# Patient Record
Sex: Female | Born: 1960 | Race: Asian | Hispanic: No | Marital: Single | Smoking: Current every day smoker
Health system: Southern US, Community
[De-identification: ages and names within clinical notes are randomized; demographics above are authoritative.]

## PROBLEM LIST (undated history)

## (undated) DIAGNOSIS — F32A Depression, unspecified: Secondary | ICD-10-CM

## (undated) DIAGNOSIS — E7800 Pure hypercholesterolemia, unspecified: Secondary | ICD-10-CM

## (undated) DIAGNOSIS — F4310 Post-traumatic stress disorder, unspecified: Secondary | ICD-10-CM

## (undated) HISTORY — PX: APPENDECTOMY: SHX54

---

## 2020-08-07 ENCOUNTER — Ambulatory Visit
Admission: EM | Admit: 2020-08-07 | Discharge: 2020-08-07 | Disposition: A | Discharge status: Home / Self Care | Payer: Non-veteran care | Attending: Emergency Medicine | Admitting: Emergency Medicine

## 2020-08-07 ENCOUNTER — Ambulatory Visit (INDEPENDENT_AMBULATORY_CARE_PROVIDER_SITE_OTHER): Payer: No Typology Code available for payment source

## 2020-08-07 ENCOUNTER — Other Ambulatory Visit: Payer: Self-pay

## 2020-08-07 ENCOUNTER — Ambulatory Visit: Payer: Non-veteran care

## 2020-08-07 ENCOUNTER — Encounter: Payer: Self-pay | Admitting: Emergency Medicine

## 2020-08-07 DIAGNOSIS — M25531 Pain in right wrist: Secondary | ICD-10-CM

## 2020-08-07 MED ORDER — IBUPROFEN 800 MG PO TABS: 800.0000 mg | ORAL_TABLET | Freq: Three times a day (TID) | ORAL | 0 refills | Status: DC | PRN

## 2020-08-07 NOTE — ED Triage Notes (Addendum)
Pt said she fell up the step a few times and catches herself with her hands. Pt not sure but no obvious deformity and able to move, just hard to pick up stuff and some swelling in her right wrist. Increasing pain this past week.

## 2020-08-07 NOTE — ED Provider Notes (Signed)
Christy Coleman    CSN: [id number] Arrival date & time: 08/07/20  1151      History   Chief Complaint Chief Complaint  Patient presents with  . Wrist Pain    HPI Christy Coleman is a 59 y.o. female.   Patient presents with right wrist pain x1 week.  She cannot identify a specific injury but states she fell up the steps a few times and caught herself with her hands.  She states the pain has gotten progressively worse.  No open wounds, rash, erythema, fever, chills, numbness, paresthesias, weakness.  OTC treatment attempted at home.  She denies pertinent medical history.  The history is provided by the patient.    History reviewed. No pertinent past medical history.  There are no problems to display for this patient.   Past Surgical History:  Procedure Laterality Date  . APPENDECTOMY      OB History   No obstetric history on file.      Home Medications    Prior to Admission medications   Medication Sig Start Date End Date Taking? Authorizing Provider  ibuprofen (ADVIL) 800 MG tablet Take 1 tablet (800 mg total) by mouth every 8 (eight) hours as needed. 08/07/20   Mickie Bail, NP    Family History History reviewed. No pertinent family history.  Social History Social History   Tobacco Use  . Smoking status: Current Every Day Smoker    Types: E-cigarettes  . Smokeless tobacco: Current User  Vaping Use  . Vaping Use: Every day  Substance Use Topics  . Alcohol use: Yes  . Drug use: Never     Allergies   Patient has no allergy information on record.   Review of Systems Review of Systems  Constitutional: Negative for chills and fever.  HENT: Negative for ear pain and sore throat.   Eyes: Negative for pain and visual disturbance.  Respiratory: Negative for cough and shortness of breath.   Cardiovascular: Negative for chest pain and palpitations.  Gastrointestinal: Negative for abdominal pain and vomiting.  Genitourinary: Negative for dysuria  and hematuria.  Musculoskeletal: Positive for arthralgias. Negative for back pain.  Skin: Negative for color change and rash.  Neurological: Negative for seizures, syncope, weakness and numbness.  All other systems reviewed and are negative.    Physical Exam Triage Vital Signs ED Triage Vitals  Enc Vitals Group     BP      Pulse      Resp      Temp      Temp src      SpO2      Weight      Height      Head Circumference      Peak Flow      Pain Score      Pain Loc      Pain Edu?      Excl. in GC?    No data found.  Updated Vital Signs BP 124/79 (BP Location: Left Arm)   Pulse 91   Temp 98.8 F (37.1 C) (Oral)   Resp 16   Ht 5' (1.524 m)   Wt 193 lb (87.5 kg)   SpO2 97%   BMI 37.69 kg/m   Visual Acuity Right Eye Distance:   Left Eye Distance:   Bilateral Distance:    Right Eye Near:   Left Eye Near:    Bilateral Near:     Physical Exam Vitals and nursing note reviewed.  Constitutional:  General: She is not in acute distress.    Appearance: She is well-developed.  HENT:     Head: Normocephalic and atraumatic.     Mouth/Throat:     Mouth: Mucous membranes are moist.  Eyes:     Conjunctiva/sclera: Conjunctivae normal.  Cardiovascular:     Rate and Rhythm: Normal rate and regular rhythm.     Heart sounds: Normal heart sounds.  Pulmonary:     Effort: Pulmonary effort is normal. No respiratory distress.     Breath sounds: Normal breath sounds.  Abdominal:     Palpations: Abdomen is soft.     Tenderness: There is no abdominal tenderness.  Musculoskeletal:        General: Tenderness present. No swelling or deformity.       Hands:     Cervical back: Neck supple.  Skin:    General: Skin is warm and dry.     Capillary Refill: Capillary refill takes less than 2 seconds.     Findings: No bruising, erythema, lesion or rash.  Neurological:     General: No focal deficit present.     Mental Status: She is alert and oriented to person, place, and time.      Sensory: No sensory deficit.     Motor: No weakness.     Gait: Gait normal.  Psychiatric:        Mood and Affect: Mood normal.        Behavior: Behavior normal.      UC Treatments / Results  Labs (all labs ordered are listed, but only abnormal results are displayed) Labs Reviewed - No data to display  EKG   Radiology DG Wrist Complete Right  Result Date: 08/07/2020 CLINICAL DATA:  Fall 1 week ago with right wrist pain. EXAM: RIGHT WRIST - COMPLETE 3+ VIEW COMPARISON:  None. FINDINGS: Minimal degenerative changes over the radiocarpal joint, carpal bones and first carpometacarpal joint. No evidence of acute fracture or dislocation. IMPRESSION: No acute findings. Electronically Signed   By: Elberta Fortis M.D.   On: 08/07/2020 12:51    Procedures Procedures (including critical care time)  Medications Ordered in UC Medications - No data to display  Initial Impression / Assessment and Plan / UC Course  I have reviewed the triage vital signs and the nursing notes.  Pertinent labs & imaging results that were available during my care of the patient were reviewed by me and considered in my medical decision making (see chart for details).   Right wrist pain.  Xray negative.  Treating with wrist splint, rest, elevation, ice packs, ibuprofen.  Instructed patient to follow-up with orthopedics if her symptoms are not improving.  Patient agrees to plan of care.   Final Clinical Impressions(s) / UC Diagnoses   Final diagnoses:  Right wrist pain     Discharge Instructions     Take the ibuprofen as prescribed.  Rest and elevate your wrist.  Apply ice packs 2-3 times a day for up to 20 minutes each.  Wear the wrist splint as needed for comfort.    Follow up with an orthopedist if you symptoms are not improving.       ED Prescriptions    Medication Sig Dispense Auth. Provider   ibuprofen (ADVIL) 800 MG tablet Take 1 tablet (800 mg total) by mouth every 8 (eight) hours as  needed. 21 tablet Mickie Bail, NP     PDMP not reviewed this encounter.   Mickie Bail, NP 08/07/20 1258

## 2020-08-07 NOTE — Discharge Instructions (Signed)
Take the ibuprofen as prescribed.  Rest and elevate your wrist.  Apply ice packs 2-3 times a day for up to 20 minutes each.  Wear the wrist splint as needed for comfort.    Follow up with an orthopedist if you symptoms are not improving.

## 2021-03-27 IMAGING — DX DG WRIST COMPLETE 3+V*R*
4 series · 4 of 4 positions shown · non-contrast
Comparison: None.

CLINICAL DATA: Fall 1 week ago with right wrist pain.

EXAM:
RIGHT WRIST - COMPLETE 3+ VIEW

[wrist pa (1 of 2)]
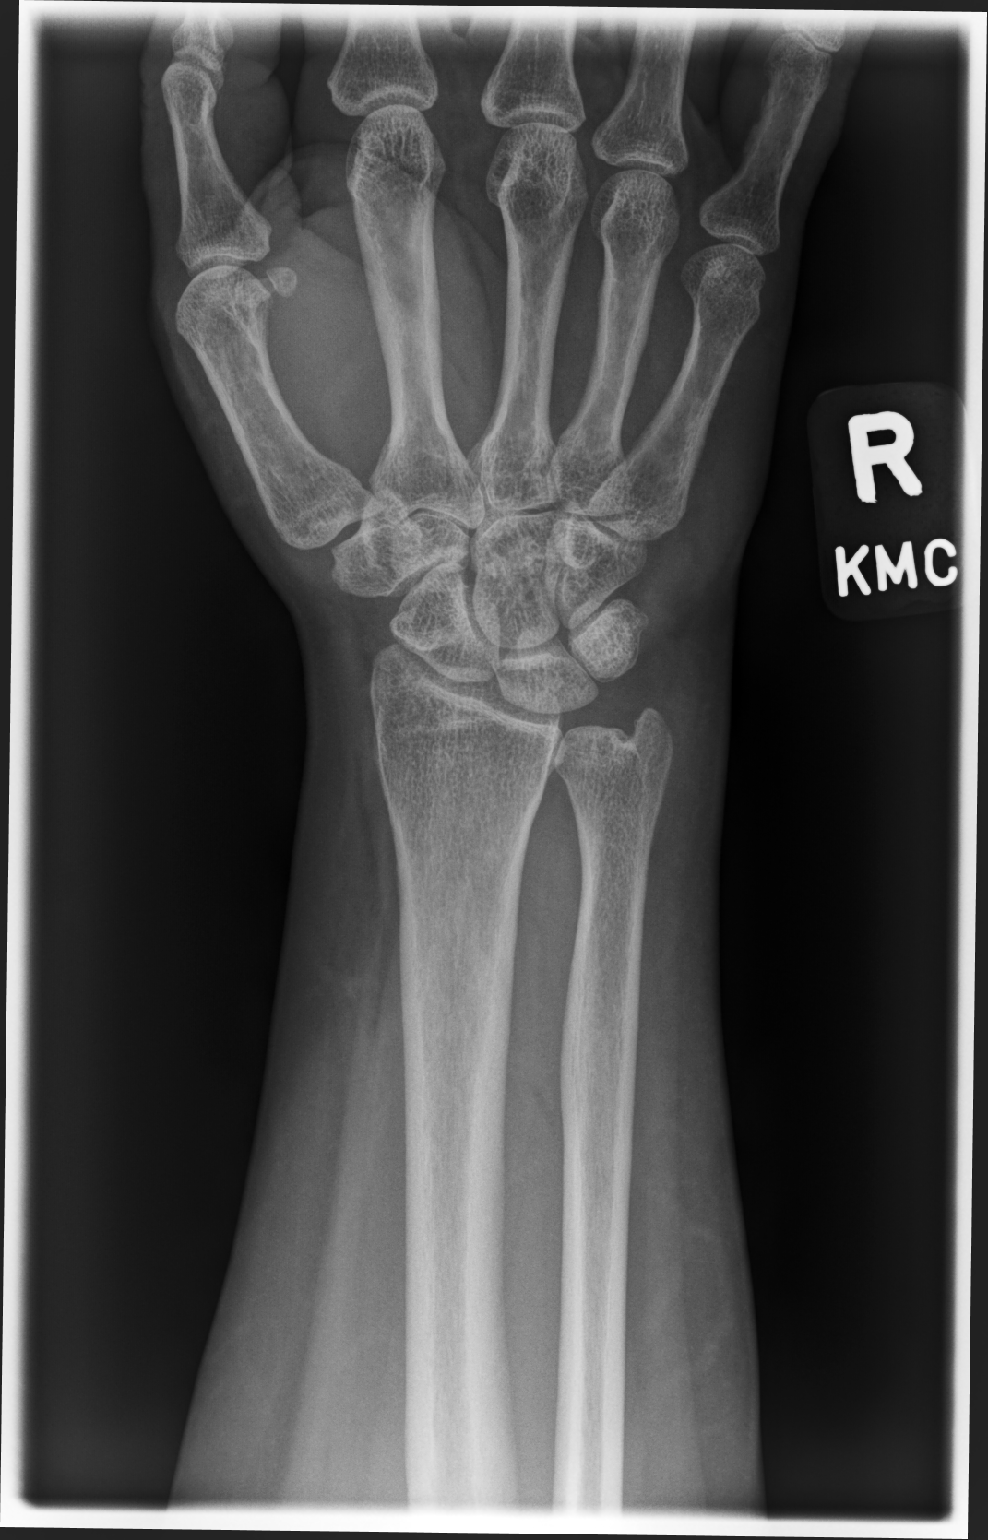

[wrist pa (2 of 2)]
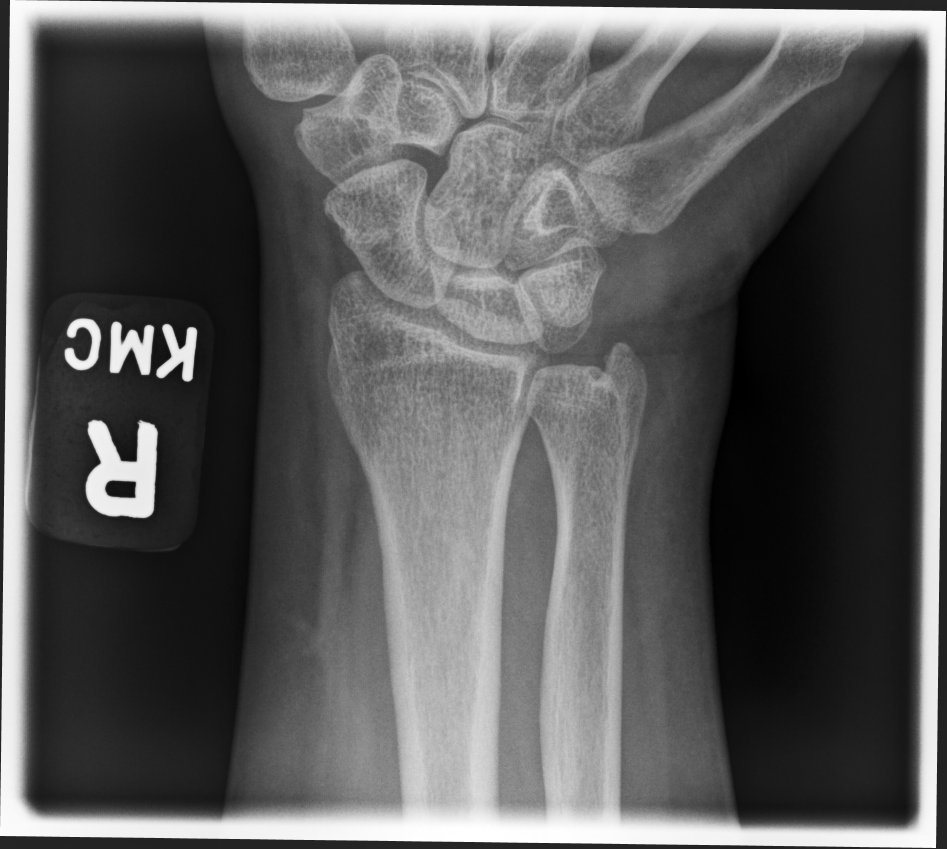

[wrist mlo]
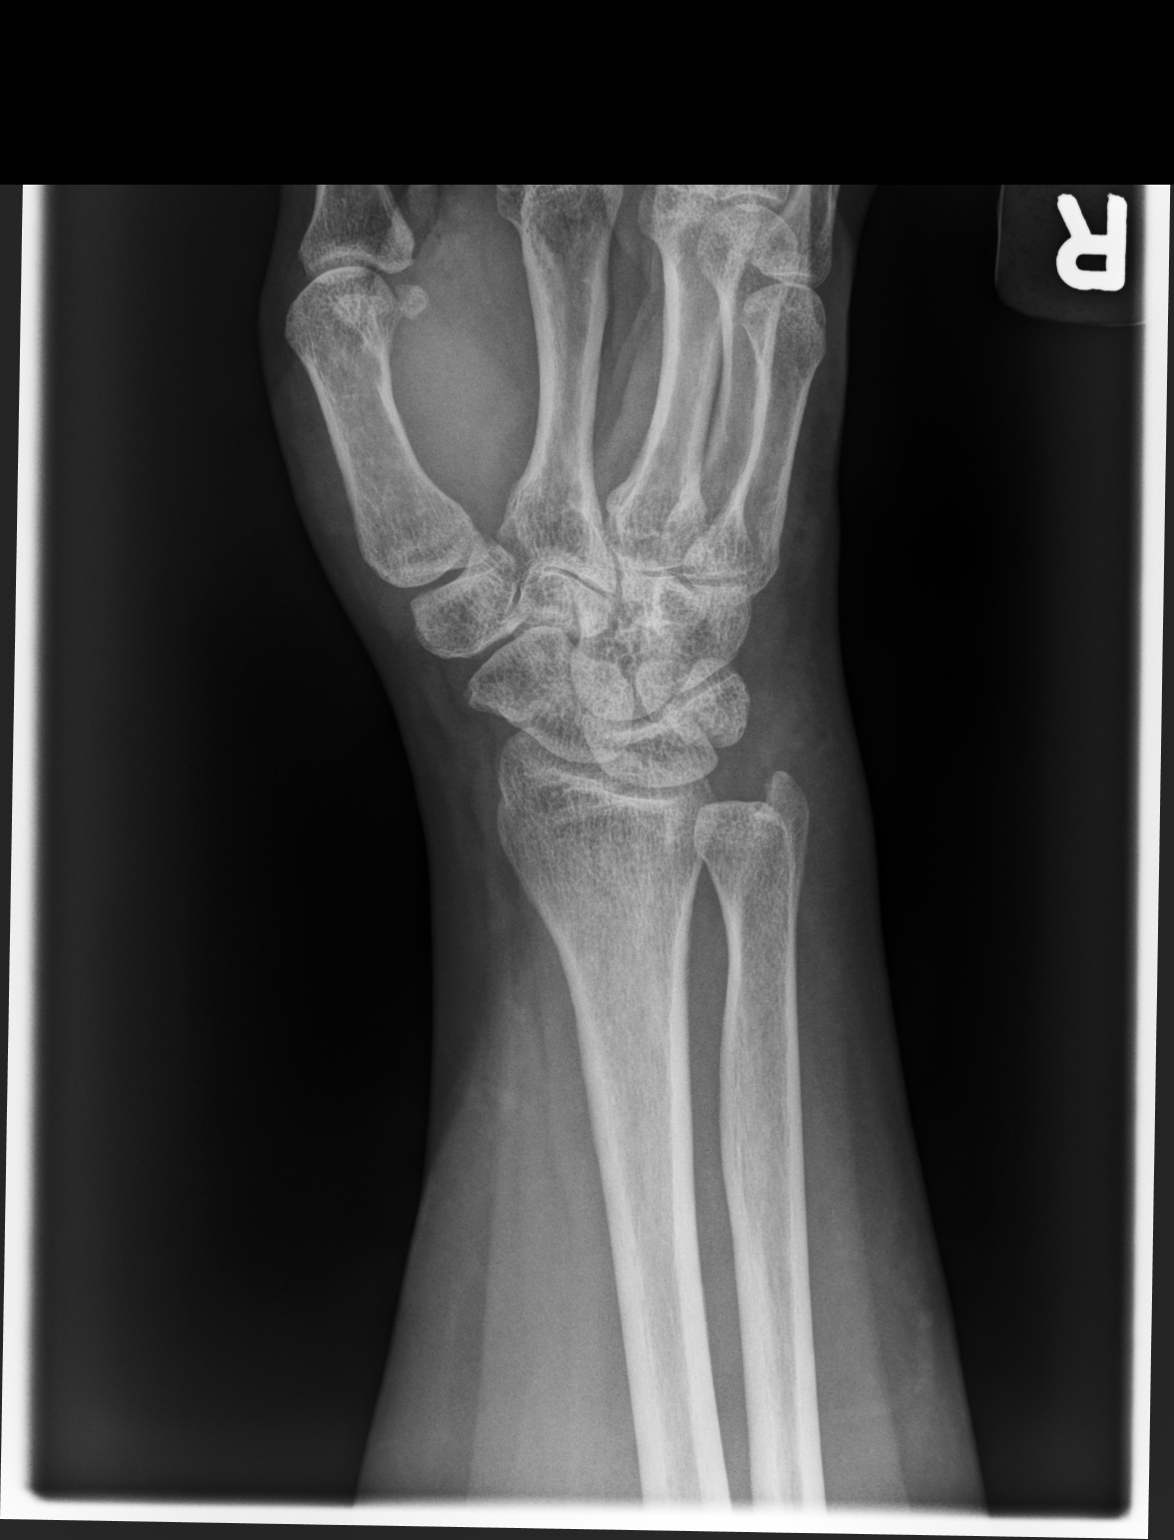

[wrist lat]
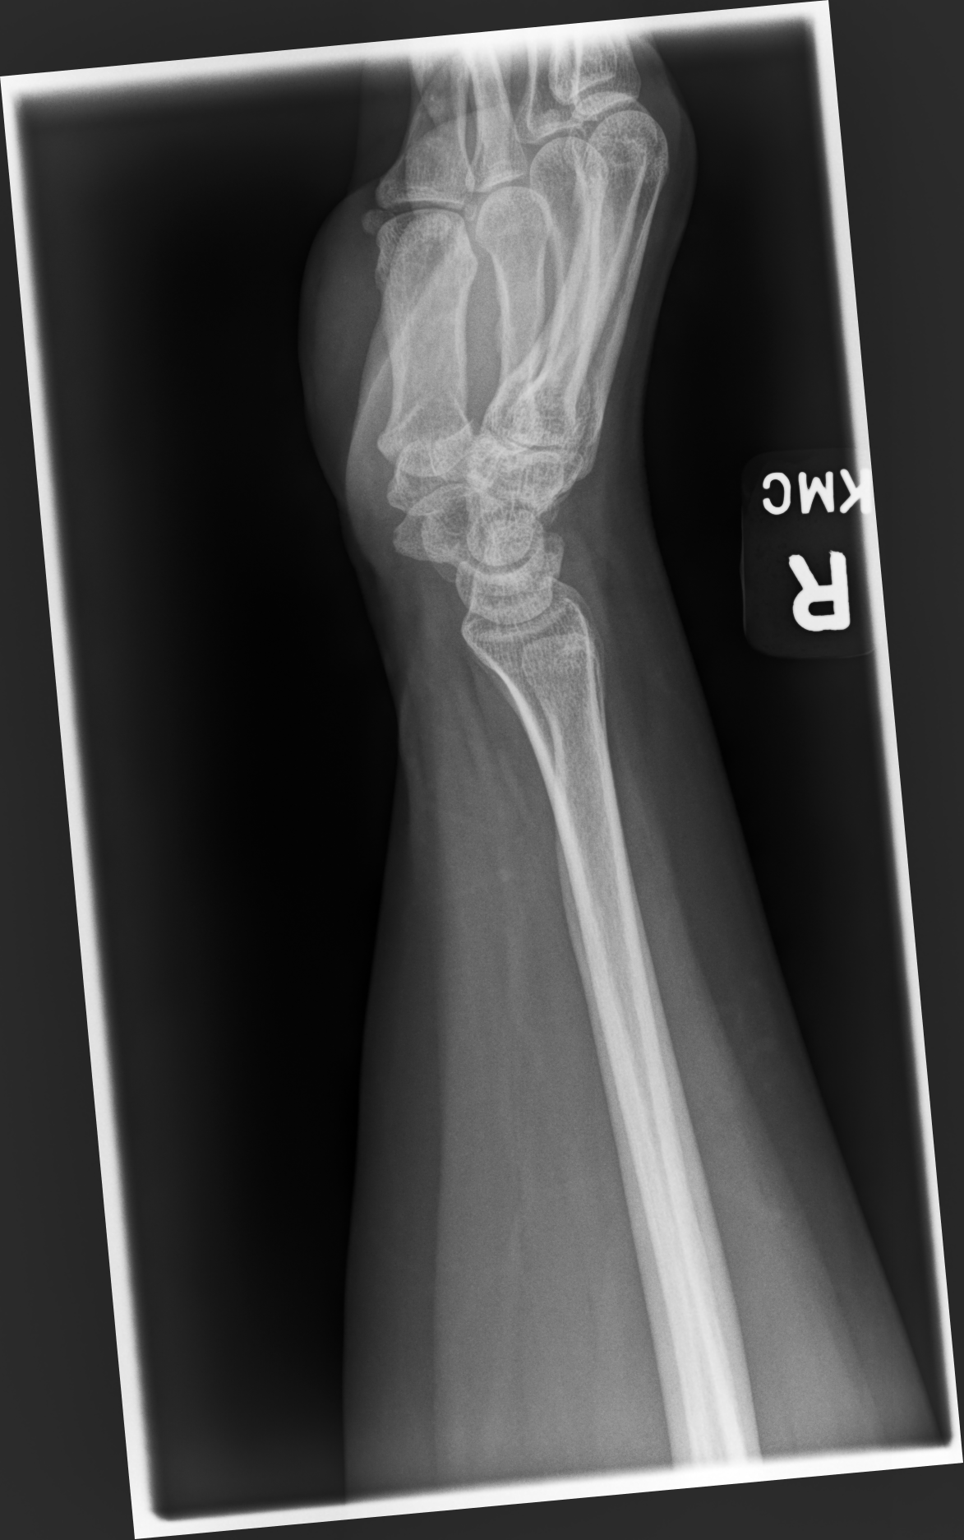

[4 of 4 positions shown; findings below may reference images not displayed]

FINDINGS: Minimal degenerative changes over the radiocarpal joint, carpal
bones and first carpometacarpal joint. No evidence of acute fracture
or dislocation.
IMPRESSION: No acute findings.

## 2021-06-29 ENCOUNTER — Encounter: Payer: Self-pay | Admitting: Emergency Medicine

## 2021-06-29 ENCOUNTER — Ambulatory Visit: Payer: Self-pay

## 2021-06-29 ENCOUNTER — Other Ambulatory Visit: Payer: Self-pay

## 2021-06-29 ENCOUNTER — Ambulatory Visit
Admission: EM | Admit: 2021-06-29 | Discharge: 2021-06-29 | Disposition: A | Discharge status: Home / Self Care | Payer: Non-veteran care | Attending: Emergency Medicine | Admitting: Emergency Medicine

## 2021-06-29 DIAGNOSIS — J0140 Acute pansinusitis, unspecified: Secondary | ICD-10-CM | POA: Diagnosis not present

## 2021-06-29 DIAGNOSIS — J069 Acute upper respiratory infection, unspecified: Secondary | ICD-10-CM

## 2021-06-29 HISTORY — DX: Depression, unspecified: F32.A

## 2021-06-29 HISTORY — DX: Pure hypercholesterolemia, unspecified: E78.00

## 2021-06-29 MED ORDER — FLUTICASONE PROPIONATE 50 MCG/ACT NA SUSP: 2.0000 | Freq: Every day | NASAL | 0 refills | Status: AC

## 2021-06-29 MED ORDER — AEROCHAMBER PLUS MISC: 2 refills | Status: AC

## 2021-06-29 MED ORDER — BENZONATATE 200 MG PO CAPS: 200.0000 mg | ORAL_CAPSULE | Freq: Three times a day (TID) | ORAL | 0 refills | Status: AC | PRN

## 2021-06-29 MED ORDER — ALBUTEROL SULFATE HFA 108 (90 BASE) MCG/ACT IN AERS
1.0000 | INHALATION_SPRAY | RESPIRATORY_TRACT | 0 refills | Status: AC | PRN
Start: 2021-06-29 — End: ?

## 2021-06-29 MED ORDER — AMOXICILLIN-POT CLAVULANATE 875-125 MG PO TABS: 1.0000 | ORAL_TABLET | Freq: Two times a day (BID) | ORAL | 0 refills | Status: DC

## 2021-06-29 NOTE — ED Provider Notes (Signed)
HPI  SUBJECTIVE:  Christy Coleman is a 60 y.o. female who presents with 5 days of fevers T-max 101, body aches, headaches, nasal congestion, rhinorrhea, postnasal drip, sore throat, loss of sense of smell and taste, cough, wheezing, chest soreness/tightness, shortness of breath.  No nausea, vomiting, diarrhea, abdominal pain.  No known COVID exposure.  She had a second dose of the COVID-vaccine.  She had a negative COVID PCR test yesterday.  No antibiotics in the past month.  She took a BC powder within the past 6 hours.  She has been doing Chloraseptic spray, BC powders, Neti pot, and NyQuil/DayQuil, cough drops, Vicks nasal spray without relief.  No aggravating factors.  She is a former smoker and has a history of hypercholesterolemia.  PMD: None.  Past Medical History:  Diagnosis Date   Depression    High cholesterol     Past Surgical History:  Procedure Laterality Date   APPENDECTOMY      History reviewed. No pertinent family history.  Social History   Tobacco Use   Smoking status: Every Day    Types: E-cigarettes   Smokeless tobacco: Current  Vaping Use   Vaping Use: Every day  Substance Use Topics   Alcohol use: Yes   Drug use: Never    No current facility-administered medications for this encounter.  Current Outpatient Medications:    albuterol (VENTOLIN HFA) 108 (90 Base) MCG/ACT inhaler, Inhale 1-2 puffs into the lungs every 4 (four) hours as needed for wheezing or shortness of breath., Disp: 1 each, Rfl: 0   amoxicillin-clavulanate (AUGMENTIN) 875-125 MG tablet, Take 1 tablet by mouth 2 (two) times daily. X 7 days, Disp: 14 tablet, Rfl: 0   ATORVASTATIN CALCIUM PO, Take by mouth., Disp: , Rfl:    benzonatate (TESSALON) 200 MG capsule, Take 1 capsule (200 mg total) by mouth 3 (three) times daily as needed for cough., Disp: 30 capsule, Rfl: 0   buPROPion (WELLBUTRIN XL) 300 MG 24 hr tablet, TAKE ONE TAB BY MOUTH EVERY 24 HOURS, Disp: , Rfl:    Cholecalciferol (VITAMIN  D3) 30 MCG/15ML LIQD, Take by mouth., Disp: , Rfl:    fluticasone (FLONASE) 50 MCG/ACT nasal spray, Place 2 sprays into both nostrils daily., Disp: 16 g, Rfl: 0   lithium 300 MG tablet, TAKE ONE TABLET BY MOUTH EVERY MORNING AND TAKE TWO TABLETS EVERY NIGHT, Disp: , Rfl:    Omega-3 Fatty Acids (FISH OIL) 1000 MG CAPS, TAKE ONE CAPSULE BY MOUTH EVERY DAY TO HELP LOWER YOUR TRIGLYCERIDES.  TAKE FROZEN IF GIVE FISHY TASTING  BURPS. TO HELP LOWER YOUR TRIGLYCERIDES.  TAKE FROZEN IF GIVE FISHY TASTING   BURPS., Disp: , Rfl:    omeprazole (PRILOSEC) 40 MG capsule, Take by mouth., Disp: , Rfl:    prazosin (MINIPRESS) 1 MG capsule, TAKE ONE CAPSULE BY MOUTH AT BEDTIME FOR 7 DAYS, THEN TAKE TWO CAPSULES AT BEDTIME FOR NIGHTMARES, Disp: , Rfl:    QUEtiapine (SEROQUEL) 100 MG tablet, TAKE TWO TABLETS BY MOUTH AT BEDTIME AND TAKE ONE-HALF TABLET TWO TIMES A DAY AS NEEDED, Disp: , Rfl:    sertraline (ZOLOFT) 100 MG tablet, TAKE TWO TABLETS BY MOUTH ONCE EVERY DAY FOR MOOD (CHANGE IN DOSE), Disp: , Rfl:    Spacer/Aero-Holding Chambers (AEROCHAMBER PLUS) inhaler, Use with inhaler, Disp: 1 each, Rfl: 2   topiramate (TOPAMAX) 25 MG tablet, 1 tab by mouth in the morning and 2 tabs by mouth in the evening for 1 week. Then 2 tabs by mouth  2 times per day thereafter., Disp: , Rfl:   Allergies  Allergen Reactions   Gabapentin Diarrhea     ROS  As noted in HPI.   Physical Exam  BP 120/82 (BP Location: Left Arm)   Pulse 81   Temp 99.4 F (37.4 C) (Oral)   Resp 16   SpO2 96%   Constitutional: Well developed, well nourished, no acute distress Eyes:  EOMI, conjunctiva normal bilaterally HENT: Normocephalic, atraumatic,mucus membranes moist.  Positive purulent nasal congestion.  Normal turbinates.  Positive maxillary, frontal sinus tenderness.  No obvious postnasal drip. Respiratory: Normal inspiratory effort, diffuse expiratory wheezing throughout all lung fields.  No rales, rhonchi.  No anterior, lateral  chest wall tenderness Cardiovascular: Normal rate, regular rhythm, no murmurs rubs or gallops. GI: nondistended skin: No rash, skin intact Musculoskeletal: no deformities Neurologic: Alert & oriented x 3, no focal neuro deficits Psychiatric: Speech and behavior appropriate   ED Course   Medications - No data to display  No orders of the defined types were placed in this encounter.   No results found for this or any previous visit (from the past 24 hour(s)). No results found.  ED Clinical Impression  1. Acute non-recurrent pansinusitis   2. Acute upper respiratory infection      ED Assessment/Plan  Did not check a COVID PCR because she had a negative PCR yesterday.  Even if it is positive, she would be out of the window for treatment.  Treating as a sinusitis with bronchospasm.  Home with Augmentin, which will also cover pneumonia, regularly scheduled albuterol with a spacer, Tylenol, no ibuprofen because of lithium, Mucinex D, Flonase, continue saline nasal irrigation, Tessalon for the cough.  Will provide primary care list and order assistance in finding a PMD.  Discussed labs, imaging, MDM, treatment plan, and plan for follow-up with patient.  patient agrees with plan.   Meds ordered this encounter  Medications   amoxicillin-clavulanate (AUGMENTIN) 875-125 MG tablet    Sig: Take 1 tablet by mouth 2 (two) times daily. X 7 days    Dispense:  14 tablet    Refill:  0   fluticasone (FLONASE) 50 MCG/ACT nasal spray    Sig: Place 2 sprays into both nostrils daily.    Dispense:  16 g    Refill:  0   Spacer/Aero-Holding Chambers (AEROCHAMBER PLUS) inhaler    Sig: Use with inhaler    Dispense:  1 each    Refill:  2    Please educate patient on use   albuterol (VENTOLIN HFA) 108 (90 Base) MCG/ACT inhaler    Sig: Inhale 1-2 puffs into the lungs every 4 (four) hours as needed for wheezing or shortness of breath.    Dispense:  1 each    Refill:  0   benzonatate (TESSALON) 200  MG capsule    Sig: Take 1 capsule (200 mg total) by mouth 3 (three) times daily as needed for cough.    Dispense:  30 capsule    Refill:  0      *This clinic note was created using Scientist, clinical (histocompatibility and immunogenetics). Therefore, there may be occasional mistakes despite careful proofreading.  ?    Domenick Gong, MD 06/30/21 [phone]

## 2021-06-29 NOTE — ED Triage Notes (Signed)
Pt c/o cough, headache, sinus pressure, chills, and bodyaches x 5 days. Pts covid test yesterday was negative.

## 2021-06-29 NOTE — Discharge Instructions (Addendum)
Continue using your Nettie pot.  Finish the Augmentin, even if you feel better.  Stop DayQuil, NyQuil, take 1000 mg of Tylenol 3-4 times a day as needed for headaches, body aches, fevers.  Mucinex D will help with the nasal congestion.  Start some Flonase.  2 puffs from your albuterol inhaler using your spacer every 4 hours for 2 days, then every 6 hours for 2 days, then as needed.  You may back off on the albuterol if you start to feel better.  Tessalon for cough.  Here is a list of primary care providers who are taking new patients:  Dr. Elizabeth Sauer [address] [place] [address] [phone]  [facility] at [facility] [address] [place], [place] [address] [phone]  [facility] [place] [address] [place] [address]  [phone]  [facility] [address] [place], [place] [address] [phone]  [facility] [address] [place]  [phone] [place], [place] [address]  Here are clinics/ other resources who will see you if you do not have insurance. Some have certain criteria that you must meet. Call them and find out what they are:  [facility]: [address] [place], [place] [address] Phone: [phone] Hours: First and Third Saturdays of each Month, 9 a.m. - 1 p.m.  [facility]: [address], [address] [place], [place] [address] Phone: [phone] Hours: Tuesday, 4 p.m. - 8 p.m. Thursday, 1 p.m. - 8 p.m. Wednesday, 9 a.m. - [facility] [address], [place], [place] [address] Phone: [phone] Pharmacy Phone Number: [phone] Dental Phone Number: [phone] [facility] Insurance Help: [phone]  Dental Hours: Monday - Thursday, 8 a.m. - 6 p.m.  [facility] [address], [place], [place] [address] Phone: [phone] Pharmacy Phone Number: [phone] [facility] Insurance Help: [phone]  [facility] [address] [place]  [place], [place] [address] Phone: [phone] Pharmacy Phone Number: [phone] [facility] Insurance Help: [phone]  [facility] [address] [place], [place] [address] Phone: [phone] [facility] Insurance Help: [phone]   [facility] [address] [place], [place] [address] Phone: [phone]  Go to www.goodrx.com  or www.costplusdrugs.com to look up your medications. This will give you a list of where you can find your prescriptions at the most affordable prices. Or ask the pharmacist what the cash price is, or if they have any other discount programs available to help make your medication more affordable. This can be less expensive than what you would pay with insurance.

## 2022-12-30 ENCOUNTER — Ambulatory Visit
Admission: RE | Admit: 2022-12-30 | Discharge: 2022-12-30 | Disposition: A | Discharge status: Home / Self Care | Payer: No Typology Code available for payment source | Source: Ambulatory Visit | Attending: Urgent Care | Admitting: Urgent Care

## 2022-12-30 VITALS — BP 119/75 | HR 96 | Temp 98.0°F | Resp 20

## 2022-12-30 DIAGNOSIS — L02211 Cutaneous abscess of abdominal wall: Secondary | ICD-10-CM

## 2022-12-30 MED ORDER — SULFAMETHOXAZOLE-TRIMETHOPRIM 800-160 MG PO TABS: 1.0000 | ORAL_TABLET | Freq: Two times a day (BID) | ORAL | 0 refills | Status: DC

## 2022-12-30 MED ORDER — SULFAMETHOXAZOLE-TRIMETHOPRIM 800-160 MG PO TABS: 1.0000 | ORAL_TABLET | Freq: Two times a day (BID) | ORAL | 0 refills | Status: AC

## 2022-12-30 NOTE — ED Provider Notes (Signed)
[facility]    CSN: [id number] Arrival date & time: 12/30/22  1106      History   Chief Complaint Chief Complaint  Patient presents with   Abscess    Under stomach and has been two weeks and draining/oozy for four days. Very painful - Entered by patient    HPI Christy Coleman is a 62 y.o. female.    Abscess   Patient presents to urgent care with complaint of painful right lower quadrant abscess x 2 weeks.  She notes drainage which appear yellow and bloody x 4 days.  She states she has been cleaning the site with alcohol.  Denies fever.  Past Medical History:  Diagnosis Date   Depression    High cholesterol     There are no problems to display for this patient.   Past Surgical History:  Procedure Laterality Date   APPENDECTOMY      OB History   No obstetric history on file.      Home Medications    Prior to Admission medications   Medication Sig Start Date End Date Taking? Authorizing Provider  albuterol (VENTOLIN HFA) 108 (90 Base) MCG/ACT inhaler Inhale 1-2 puffs into the lungs every 4 (four) hours as needed for wheezing or shortness of breath. 06/29/21   Domenick Gong, MD  amoxicillin-clavulanate (AUGMENTIN) 875-125 MG tablet Take 1 tablet by mouth 2 (two) times daily. X 7 days 06/29/21   Domenick Gong, MD  ATORVASTATIN CALCIUM PO Take by mouth.    [provider]  benzonatate (TESSALON) 200 MG capsule Take 1 capsule (200 mg total) by mouth 3 (three) times daily as needed for cough. 06/29/21   Domenick Gong, MD  buPROPion (WELLBUTRIN XL) 300 MG 24 hr tablet TAKE ONE TAB BY MOUTH EVERY 24 HOURS 03/04/21   [provider]  Cholecalciferol (VITAMIN D3) 30 MCG/15ML LIQD Take by mouth.    [provider]  fluticasone (FLONASE) 50 MCG/ACT nasal spray Place 2 sprays into both nostrils daily. 06/29/21   Domenick Gong, MD  lithium 300 MG tablet TAKE ONE TABLET BY MOUTH EVERY MORNING AND TAKE TWO TABLETS EVERY NIGHT  03/04/21   [provider]  Omega-3 Fatty Acids (FISH OIL) 1000 MG CAPS TAKE ONE CAPSULE BY MOUTH EVERY DAY TO HELP LOWER YOUR TRIGLYCERIDES.  TAKE FROZEN IF GIVE FISHY TASTING  BURPS. TO HELP LOWER YOUR TRIGLYCERIDES.  TAKE FROZEN IF GIVE FISHY TASTING   BURPS. 07/04/20   [provider]  omeprazole (PRILOSEC) 40 MG capsule Take by mouth. 11/01/13   [provider]  prazosin (MINIPRESS) 1 MG capsule TAKE ONE CAPSULE BY MOUTH AT BEDTIME FOR 7 DAYS, THEN TAKE TWO CAPSULES AT BEDTIME FOR NIGHTMARES 06/09/21   [provider]  QUEtiapine (SEROQUEL) 100 MG tablet TAKE TWO TABLETS BY MOUTH AT BEDTIME AND TAKE ONE-HALF TABLET TWO TIMES A DAY AS NEEDED 06/09/21   [provider]  sertraline (ZOLOFT) 100 MG tablet TAKE TWO TABLETS BY MOUTH ONCE EVERY DAY FOR MOOD (CHANGE IN DOSE) 03/04/21   [provider]  Spacer/Aero-Holding Chambers (AEROCHAMBER PLUS) inhaler Use with inhaler 06/29/21   Domenick Gong, MD  topiramate (TOPAMAX) 25 MG tablet 1 tab by mouth in the morning and 2 tabs by mouth in the evening for 1 week. Then 2 tabs by mouth 2 times per day thereafter. 11/22/13   [provider]    Family History History reviewed. No pertinent family history.  Social History Social History   Tobacco Use  Smoking status: Every Day    Types: E-cigarettes   Smokeless tobacco: Current  Vaping Use   Vaping Use: Every day  Substance Use Topics   Alcohol use: Yes   Drug use: Never     Allergies   Gabapentin   Review of Systems Review of Systems   Physical Exam Triage Vital Signs ED Triage Vitals [12/30/22 1113]  Enc Vitals Group     BP      Pulse      Resp      Temp      Temp src      SpO2      Weight      Height      Head Circumference      Peak Flow      Pain Score 5     Pain Loc      Pain Edu?      Excl. in GC?    No data found.  Updated Vital Signs There were no vitals taken for this visit.  Visual Acuity Right  Eye Distance:   Left Eye Distance:   Bilateral Distance:    Right Eye Near:   Left Eye Near:    Bilateral Near:     Physical Exam Abdominal:     Palpations: Abdomen is soft.     Tenderness: There is abdominal tenderness.        UC Treatments / Results  Labs (all labs ordered are listed, but only abnormal results are displayed) Labs Reviewed - No data to display  EKG   Radiology No results found.  Procedures Incision and Drainage  Date/Time: 12/30/2022 11:50 AM  Performed by: Charma Igo, FNP Authorized by: Charma Igo, FNP   Consent:    Consent obtained:  Verbal   Consent given by:  Patient   Risks discussed:  Bleeding, incomplete drainage and pain   Alternatives discussed:  No treatment Universal protocol:    Procedure explained and questions answered to patient or proxy's satisfaction: yes     Relevant documents present and verified: yes     Test results available : no     Imaging studies available: no     Required blood products, implants, devices, and special equipment available: no     Site/side marked: no     Immediately prior to procedure, a time out was called: yes     Patient identity confirmed:  Verbally with patient Location:    Type:  Abscess   Size:  3 cm   Location:  Trunk   Trunk location:  Abdomen Pre-procedure details:    Skin preparation:  Povidone-iodine Sedation:    Sedation type:  None Anesthesia:    Anesthesia method:  Topical application and local infiltration   Topical anesthesia: skin spray.   Local anesthetic:  Lidocaine 1% WITH epi Procedure type:    Complexity:  Simple Procedure details:    Ultrasound guidance: no     Needle aspiration: no     Incision types:  Stab incision   Incision depth:  Dermal   Wound management:  Probed and deloculated and irrigated with saline   Drainage:  Purulent   Drainage amount:  Scant   Wound treatment:  Wound left open   Packing materials:  1/4 in iodoform gauze   Amount  1/4" iodoform:  3 Post-procedure details:    Procedure completion:  Tolerated  (including critical care time)  Medications Ordered in UC Medications - No data to display  Initial Impression /  Assessment and Plan / UC Course  I have reviewed the triage vital signs and the nursing notes.  Pertinent labs & imaging results that were available during my care of the patient were reviewed by me and considered in my medical decision making (see chart for details).   Cutaneous abscess of RLQ, possibly HS, first episode. Will attempt I&D. Prescribing bactrim.  See procedure note.   Final Clinical Impressions(s) / UC Diagnoses   Final diagnoses:  None   Discharge Instructions   None    ED Prescriptions   None    PDMP not reviewed this encounter.   Charma Igo, [place] 12/30/22 1155

## 2022-12-30 NOTE — Discharge Instructions (Signed)
Remove packing after 24 hours.  Use compress of warm water with dissolved Epsom salts multiple times daily.  Compress abscess to try to express discharge.  Apply antibacterial ointment after compress and cover with a clean dressing.  Follow up here or with your primary care or dermatology provider if your symptoms are worsening or not improving.

## 2022-12-30 NOTE — ED Triage Notes (Signed)
Patient presents to UC for RLQ abscess x 2 weeks. Drainage noted x 4 days. Cleaning site with alcohol.   Denies fever.

## 2023-10-10 ENCOUNTER — Encounter: Payer: Self-pay | Admitting: Intensive Care

## 2023-10-10 ENCOUNTER — Emergency Department
Admission: EM | Admit: 2023-10-10 | Discharge: 2023-10-10 | Disposition: A | Discharge status: Home / Self Care | Payer: No Typology Code available for payment source | Attending: Emergency Medicine | Admitting: Emergency Medicine

## 2023-10-10 ENCOUNTER — Emergency Department: Payer: No Typology Code available for payment source

## 2023-10-10 ENCOUNTER — Other Ambulatory Visit: Payer: Self-pay

## 2023-10-10 DIAGNOSIS — Z20822 Contact with and (suspected) exposure to covid-19: Secondary | ICD-10-CM | POA: Insufficient documentation

## 2023-10-10 DIAGNOSIS — J029 Acute pharyngitis, unspecified: Secondary | ICD-10-CM | POA: Diagnosis not present

## 2023-10-10 DIAGNOSIS — J069 Acute upper respiratory infection, unspecified: Secondary | ICD-10-CM

## 2023-10-10 DIAGNOSIS — R059 Cough, unspecified: Secondary | ICD-10-CM | POA: Diagnosis present

## 2023-10-10 HISTORY — DX: Post-traumatic stress disorder, unspecified: F43.10

## 2023-10-10 LAB — CBC WITH DIFFERENTIAL/PLATELET
Abs Immature Granulocytes: 0.08 10*3/uL — ABNORMAL HIGH (ref 0.00–0.07)
Basophils Absolute: 0.1 10*3/uL (ref 0.0–0.1)
Basophils Relative: 1 %
Eosinophils Absolute: 0.2 10*3/uL (ref 0.0–0.5)
Eosinophils Relative: 2 %
HCT: 42.7 % (ref 36.0–46.0)
Hemoglobin: 13.8 g/dL (ref 12.0–15.0)
Immature Granulocytes: 1 %
Lymphocytes Relative: 27 %
Lymphs Abs: 2.1 10*3/uL (ref 0.7–4.0)
MCH: 28.1 pg (ref 26.0–34.0)
MCHC: 32.3 g/dL (ref 30.0–36.0)
MCV: 87 fL (ref 80.0–100.0)
Monocytes Absolute: 0.4 10*3/uL (ref 0.1–1.0)
Monocytes Relative: 6 %
Neutro Abs: 5 10*3/uL (ref 1.7–7.7)
Neutrophils Relative %: 63 %
Platelets: 281 10*3/uL (ref 150–400)
RBC: 4.91 MIL/uL (ref 3.87–5.11)
RDW: 13.2 % (ref 11.5–15.5)
WBC: 7.8 10*3/uL (ref 4.0–10.5)
nRBC: 0 % (ref 0.0–0.2)

## 2023-10-10 LAB — COMPREHENSIVE METABOLIC PANEL
ALT: 34 U/L (ref 0–44)
AST: 41 U/L (ref 15–41)
Albumin: 4.1 g/dL (ref 3.5–5.0)
Alkaline Phosphatase: 67 U/L (ref 38–126)
Anion gap: 10 (ref 5–15)
BUN: 9 mg/dL (ref 6–20)
CO2: 24 mmol/L (ref 22–32)
Calcium: 9.2 mg/dL (ref 8.9–10.3)
Chloride: 103 mmol/L (ref 98–111)
Creatinine, Ser: 0.66 mg/dL (ref 0.44–1.00)
GFR, Estimated: >60 mL/min (ref >60)
Glucose, Bld: 170 mg/dL — ABNORMAL HIGH (ref 70–99)
Potassium: 4.1 mmol/L (ref 3.5–5.1)
Sodium: 137 mmol/L (ref 135–145)
Total Bilirubin: 0.8 mg/dL (ref 0.0–1.2)
Total Protein: 7.3 g/dL (ref 6.5–8.1)

## 2023-10-10 LAB — RESP PANEL BY RT-PCR (RSV, FLU A&B, COVID)  RVPGX2
Influenza A by PCR: NEGATIVE
Influenza B by PCR: NEGATIVE
Resp Syncytial Virus by PCR: NEGATIVE
SARS Coronavirus 2 by RT PCR: NEGATIVE

## 2023-10-10 LAB — GROUP A STREP BY PCR: Group A Strep by PCR: NOT DETECTED

## 2023-10-10 MED ORDER — ACETAMINOPHEN 325 MG PO TABS
650.0000 mg | ORAL_TABLET | Freq: Once | ORAL | Status: AC
  Administered 2023-10-10: 650 mg via ORAL
  Filled 2023-10-10: qty 2

## 2023-10-10 NOTE — ED Triage Notes (Signed)
Patient c/o sob, right sided jaw pain, headaches, and generalized body aches X2 weeks.

## 2023-10-10 NOTE — ED Notes (Signed)
See triage note  Presents with some body aches,sore throat and some cough  States sx's started about 1 week ago

## 2023-10-10 NOTE — Discharge Instructions (Signed)
Your blood tests, COVID, flu, RSV and strep swabs, and chest x-ray are normal.  You may continue to take Tylenol/ibuprofen per package instructions to help with your symptoms.  Please return for any new, worsening, or change in symptoms or other concerns.  It was a pleasure caring for you today.

## 2023-10-10 NOTE — ED Provider Notes (Signed)
[facility] Provider Note    Event Date/Time   First MD Initiated Contact with Patient 10/10/23 1336     (approximate)   History   Shortness of Breath and Headache   HPI  Christy Coleman is a 63 y.o. female who presents today for evaluation of 2 weeks of cough, nasal congestion, and bodyaches, as well as 3 days of sore throat and right sided neck pain.  She reports that her grandchildren have been sick with the same symptoms.  She denies chest pain or shortness of breath.  She denies fevers or chills.  No abdominal pain, nausea, vomiting, diarrhea.  There are no active problems to display for this patient.         Physical Exam   Triage Vital Signs: ED Triage Vitals [10/10/23 1210]  Encounter Vitals Group     BP (!) 130/92     Systolic BP Percentile      Diastolic BP Percentile      Pulse Rate 86     Resp 20     Temp 98.4 F (36.9 C)     Temp Source Oral     SpO2 100 %     Weight 210 lb (95.3 kg)     Height 5' (1.524 m)     Head Circumference      Peak Flow      Pain Score 6     Pain Loc      Pain Education      Exclude from Growth Chart     Most recent vital signs: Vitals:   10/10/23 1210  BP: (!) 130/92  Pulse: 86  Resp: 20  Temp: 98.4 F (36.9 C)  SpO2: 100%    Physical Exam Vitals and nursing note reviewed.  Constitutional:      General: Awake and alert. No acute distress.    Appearance: Normal appearance. HENT:     Head: Normocephalic and atraumatic.     Mouth: Mucous membranes are moist. Uvula midline.  No tonsillar exudate.  No soft palate fluctuance.  No trismus.  No voice change.  No sublingual swelling.  Mild right-sided cervical lymphadenopathy.  No nuchal rigidity.  Eyes:     General: PERRL. Normal EOMs        Right eye: No discharge.        Left eye: No discharge.     Conjunctiva/sclera: Conjunctivae normal.  Cardiovascular:     Rate and Rhythm: Normal rate and regular rhythm.     Pulses: Normal pulses.   Pulmonary:     Effort: Pulmonary effort is normal. No respiratory distress.  Able to speak easily in complete sentences, no accessory muscle use    Breath sounds: Normal breath sounds.  Abdominal:     Abdomen is soft. There is no abdominal tenderness. No rebound or guarding. No distention. Musculoskeletal:        General: No swelling. Normal range of motion.     Cervical back: Normal range of motion and neck supple.  Right anterior cervical chain lymphadenopathy Skin:    General: Skin is warm and dry.     Capillary Refill: Capillary refill takes less than 2 seconds.     Findings: No rash.  Neurological:     Mental Status: The patient is awake and alert.      ED Results / Procedures / Treatments   Labs (all labs ordered are listed, but only abnormal results are displayed) Labs Reviewed  CBC WITH DIFFERENTIAL/PLATELET -  Abnormal; Notable for the following components:      Result Value   Abs Immature Granulocytes 0.08 (*)    All other components within normal limits  COMPREHENSIVE METABOLIC PANEL - Abnormal; Notable for the following components:   Glucose, Bld 170 (*)    All other components within normal limits  RESP PANEL BY RT-PCR (RSV, FLU A&B, COVID)  [id number]  GROUP A STREP BY PCR  URINALYSIS, ROUTINE W REFLEX MICROSCOPIC     EKG     RADIOLOGY I independently reviewed and interpreted imaging and agree with radiologists findings.     PROCEDURES:  Critical Care performed:   Procedures   MEDICATIONS ORDERED IN ED: Medications  acetaminophen (TYLENOL) tablet 650 mg (650 mg Oral Given 10/10/23 1348)     IMPRESSION / MDM / ASSESSMENT AND PLAN / ED COURSE  I reviewed the triage vital signs and the nursing notes.   Differential diagnosis includes, but is not limited to, COVID, influenza, bronchitis, pneumonia, strep pharyngitis, viral pharyngitis, other viral syndrome.  Patient is awake and alert, hemodynamically stable and afebrile.  She is nontoxic in  appearance.  She has a hoarse voice and cervical lymphadenopathy consistent with a pharyngitis.  Labs obtained in triage are overall reassuring, chest x-ray is negative for pneumonia.  Will add on COVID, flu, RSV and strep swabs.  In the meantime she was treated symptomatically with Tylenol.  Swabs are negative.  Upon reevaluation, patient reports that she feels significantly improved.  We discussed continued symptomatic management and strict return precautions.  Patient understands and agrees with plan.  She was discharged in stable condition.   Patient's presentation is most consistent with acute complicated illness / injury requiring diagnostic workup.      FINAL CLINICAL IMPRESSION(S) / ED DIAGNOSES   Final diagnoses:  Upper respiratory tract infection, unspecified type  Pharyngitis, unspecified etiology     Rx / DC Orders   ED Discharge Orders     None        Note:  This document was prepared using Dragon voice recognition software and may include unintentional dictation errors.   Keturah Shavers 10/10/23 1704    Minna Antis, MD 10/13/23 1331
